# Patient Record
Sex: Female | Born: 1995 | Hispanic: Yes | Marital: Single
Health system: Southern US, Community
[De-identification: ages and names within clinical notes are randomized; demographics above are authoritative.]

---

## 2020-08-01 ENCOUNTER — Other Ambulatory Visit: Payer: Self-pay

## 2020-08-01 ENCOUNTER — Emergency Department (HOSPITAL_COMMUNITY)
Admission: EM | Admit: 2020-08-01 | Discharge: 2020-08-02 | Disposition: A | Discharge status: Home / Self Care | Payer: Self-pay | Attending: Emergency Medicine | Admitting: Emergency Medicine

## 2020-08-01 ENCOUNTER — Emergency Department (HOSPITAL_COMMUNITY): Payer: Self-pay

## 2020-08-01 DIAGNOSIS — Y999 Unspecified external cause status: Secondary | ICD-10-CM | POA: Insufficient documentation

## 2020-08-01 DIAGNOSIS — S51011A Laceration without foreign body of right elbow, initial encounter: Secondary | ICD-10-CM | POA: Insufficient documentation

## 2020-08-01 DIAGNOSIS — Y92009 Unspecified place in unspecified non-institutional (private) residence as the place of occurrence of the external cause: Secondary | ICD-10-CM | POA: Insufficient documentation

## 2020-08-01 DIAGNOSIS — W19XXXA Unspecified fall, initial encounter: Secondary | ICD-10-CM | POA: Insufficient documentation

## 2020-08-01 DIAGNOSIS — Y939 Activity, unspecified: Secondary | ICD-10-CM | POA: Insufficient documentation

## 2020-08-01 NOTE — ED Triage Notes (Signed)
Pt reports she fell outside her house, can't remember what caused the fall she believes due to ETOH.  Right laceration below elbow and swollen hand.  Pt also reports a "horrible headache, I might have hit my head" but denies LOC.

## 2021-04-06 IMAGING — CT CT HEAD W/O CM
4 series · 17 of 47 positions shown, 19 images · non-contrast
Comparison: None.

CLINICAL DATA: Status post trauma.

EXAM:
CT HEAD WITHOUT CONTRAST
TECHNIQUE: Contiguous axial images were obtained from the base of the skull
through the vertex without intravenous contrast.

[Series 3: head wo · axial · 0.39mm/px · z∈[+1158,+1278]mm · 7 of 32 slices shown, 9 images]
[im 4/32  brain]
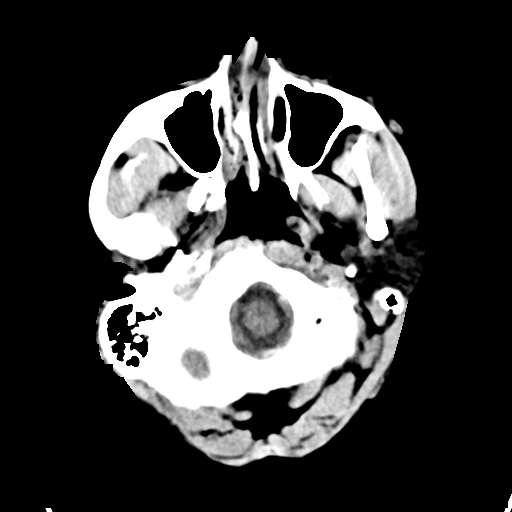
[im 4/32  bone]
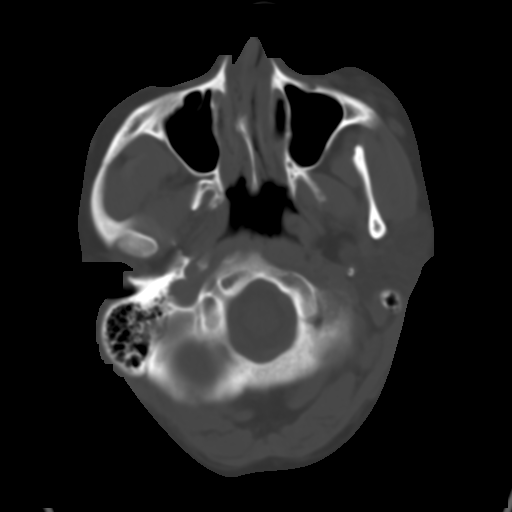
[im 8/32  brain]
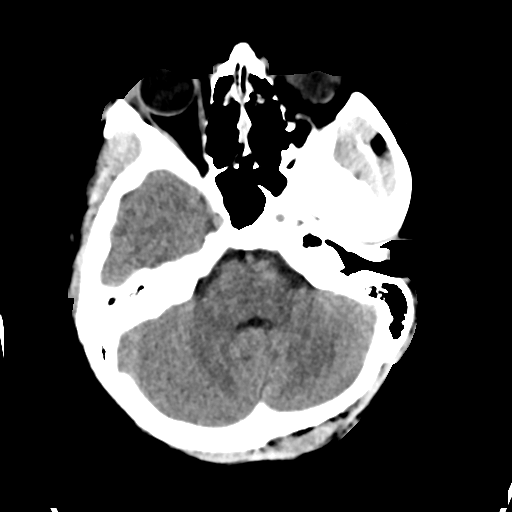
[im 12/32  brain]
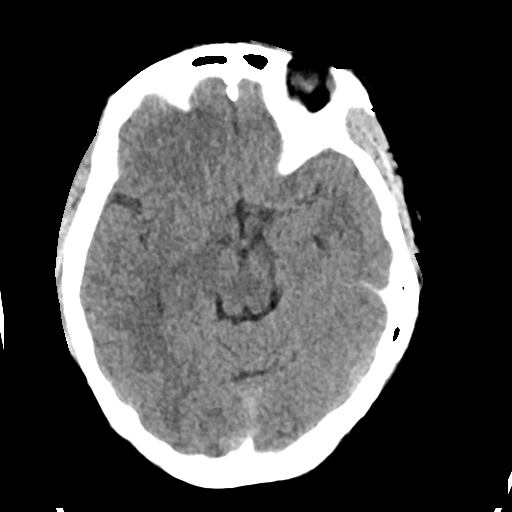
[im 16/32  brain]
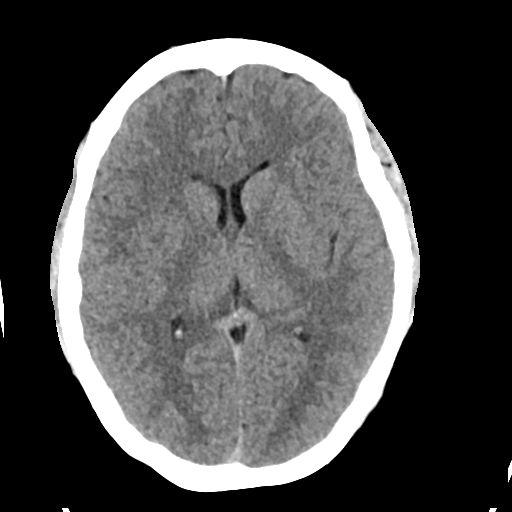
[im 20/32  brain]
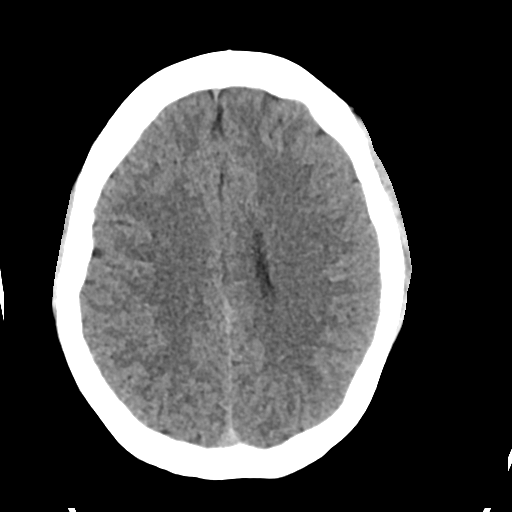
[im 20/32  bone]
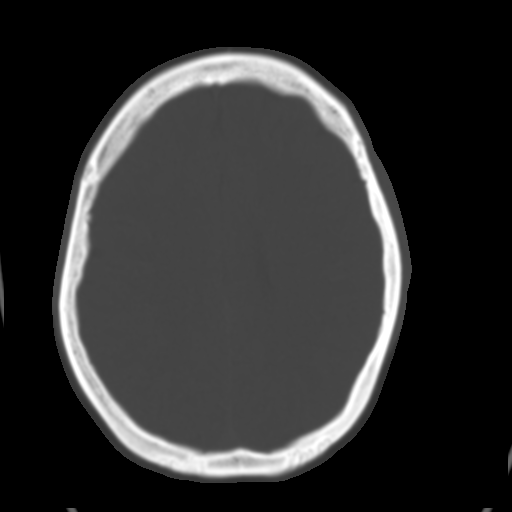
[im 24/32  brain]
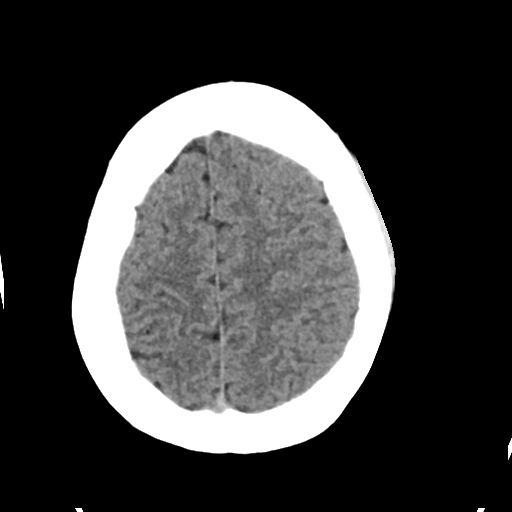
[im 28/32  brain]
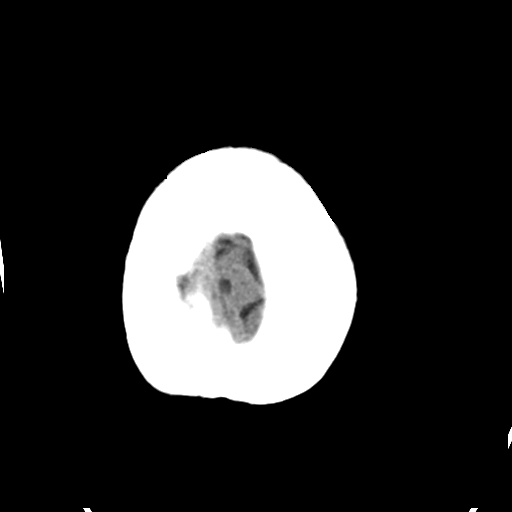

[Series 4: head bone · axial · 0.39mm/px · z∈[+1157,+1213]mm · 4 of 80 slices shown]
[im 8/80  bone]
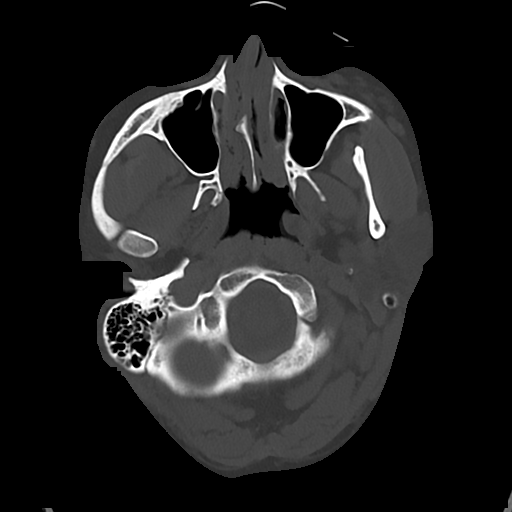
[im 16/80  bone]
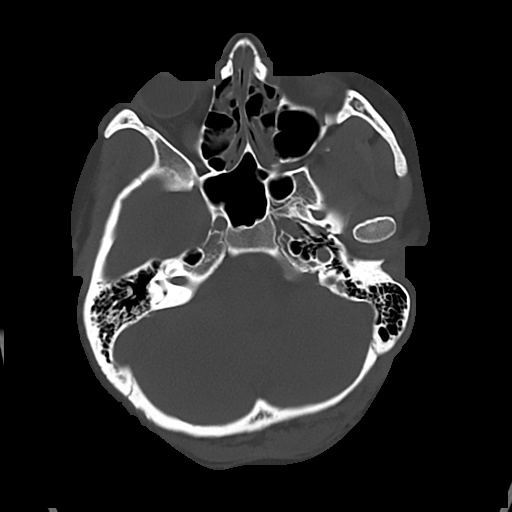
[im 24/80  bone]
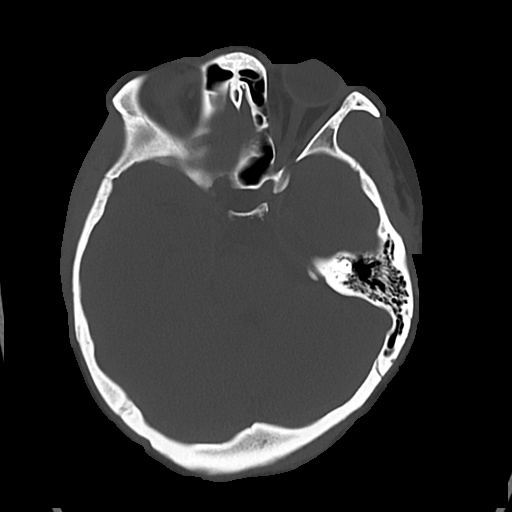
[im 36/80  bone]
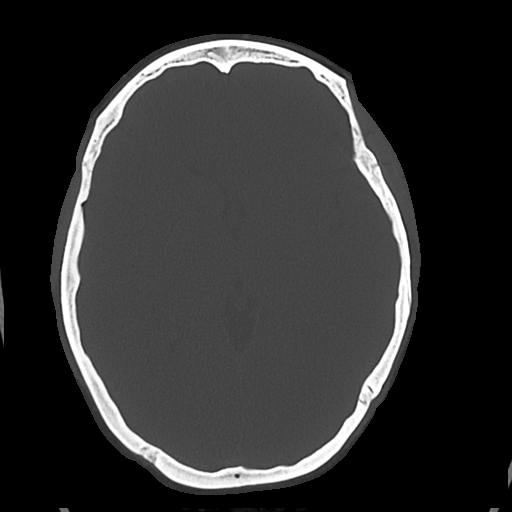

[Series 5: cor soft · coronal · 0.31mm/px · 3 of 67 slices shown]
[im 23/67  brain]
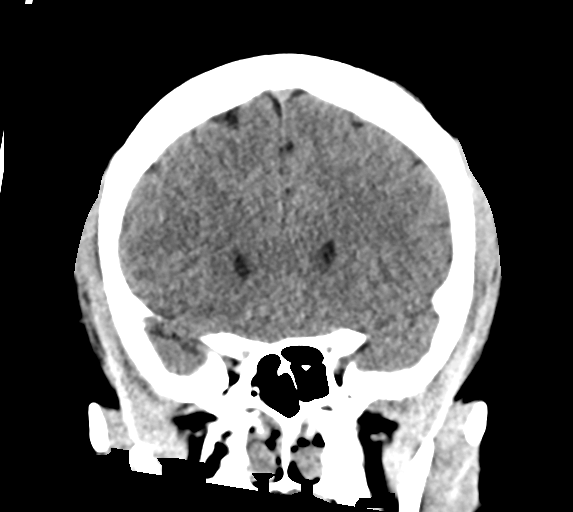
[im 30/67  brain]
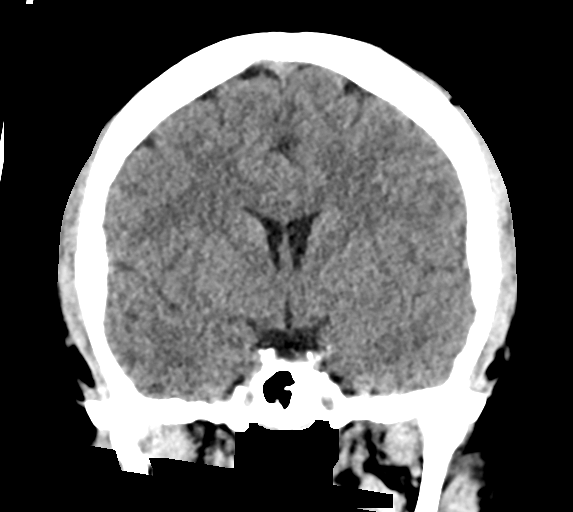
[im 37/67  brain]
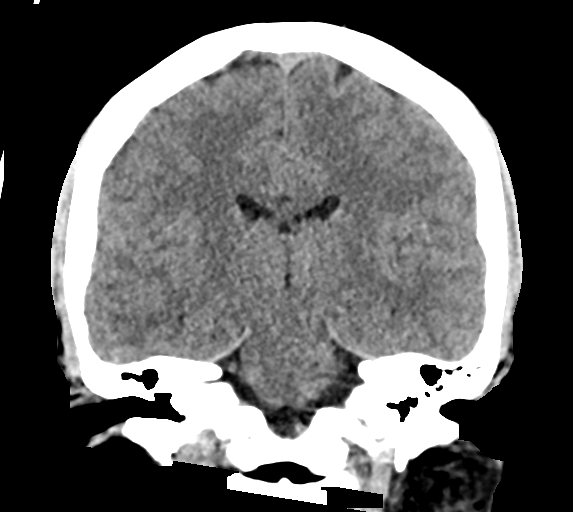

[Series 6: sag soft · sagittal · 0.31mm/px · 3 of 60 slices shown]
[im 23/60  brain]
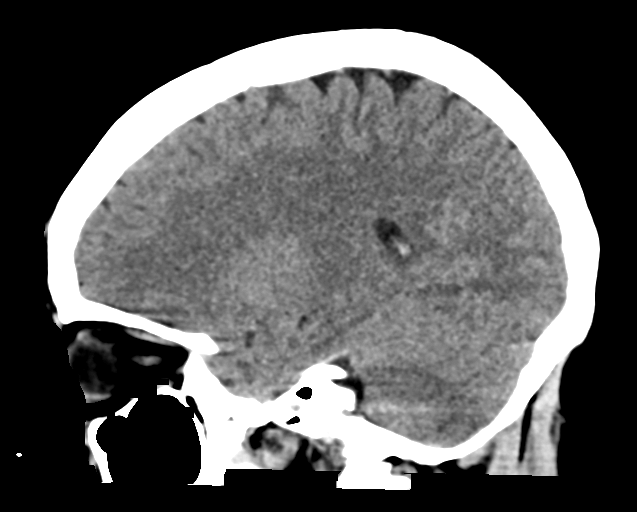
[im 30/60  brain]
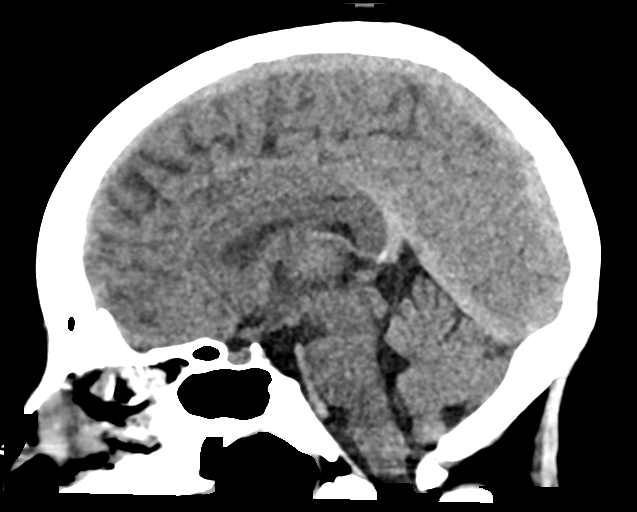
[im 37/60  brain]
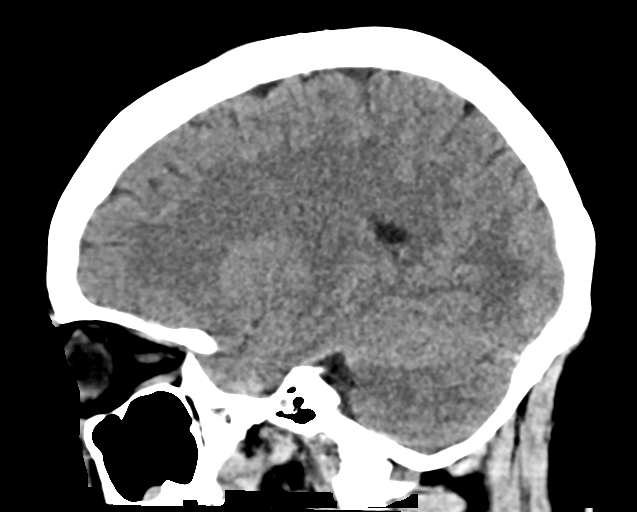

[17 of 47 positions shown; findings below may reference images not displayed]

FINDINGS: Brain: No evidence of acute infarction, hemorrhage, hydrocephalus,
extra-axial collection or mass lesion/mass effect.

Vascular: No hyperdense vessel or unexpected calcification.

Skull: Normal. Negative for fracture or focal lesion.

Sinuses/Orbits: Mild bilateral ethmoid sinus mucosal thickening is
seen.

Other: None.
IMPRESSION: 1. No acute intracranial abnormality.
2. Mild bilateral ethmoid sinus disease.
